# Patient Record
Sex: Female | Born: 1985 | Race: White | Hispanic: No | Marital: Married | Smoking: Never smoker
Health system: Southern US, Community
[De-identification: ages and names within clinical notes are randomized; demographics above are authoritative.]

## PROBLEM LIST (undated history)

## (undated) DIAGNOSIS — I10 Essential (primary) hypertension: Secondary | ICD-10-CM

---

## 2018-10-24 ENCOUNTER — Encounter: Payer: Self-pay | Admitting: Family Medicine

## 2018-10-24 ENCOUNTER — Other Ambulatory Visit: Payer: Self-pay

## 2018-10-24 ENCOUNTER — Emergency Department (INDEPENDENT_AMBULATORY_CARE_PROVIDER_SITE_OTHER)
Admission: EM | Admit: 2018-10-24 | Discharge: 2018-10-24 | Disposition: A | Discharge status: Home / Self Care | Payer: BC Managed Care – PPO | Source: Home / Self Care

## 2018-10-24 DIAGNOSIS — S61432A Puncture wound without foreign body of left hand, initial encounter: Secondary | ICD-10-CM | POA: Diagnosis not present

## 2018-10-24 DIAGNOSIS — Z23 Encounter for immunization: Secondary | ICD-10-CM

## 2018-10-24 MED ORDER — MUPIROCIN 2 % EX OINT: 1.0000 "application " | TOPICAL_OINTMENT | Freq: Three times a day (TID) | CUTANEOUS | 1 refills | Status: AC

## 2018-10-24 MED ORDER — TETANUS-DIPHTH-ACELL PERTUSSIS 5-2.5-18.5 LF-MCG/0.5 IM SUSP
0.5000 mL | Freq: Once | INTRAMUSCULAR | Status: AC
  Administered 2018-10-24: 0.5 mL via INTRAMUSCULAR

## 2018-10-24 NOTE — ED Provider Notes (Signed)
[facility]    CSN: [id number] Arrival date & time: 10/24/18  1604      History   Chief Complaint Chief Complaint  Patient presents with  . Puncture Wound    HPI Katie Gordon is a 33 y.o. female.   This is the initial [facility] urgent care visit for this 33 year old woman who presents with a left hand laceration.  She punctured the thumb web space last night and every time she opens her hand wide, it bleeds.  Minimal pain.  Unsure of last Td.     History reviewed. No pertinent past medical history.  There are no active problems to display for this patient.   History reviewed. No pertinent surgical history.  OB History   No obstetric history on file.      Home Medications    Prior to Admission medications   Medication Sig Start Date End Date Taking? Authorizing Provider  mupirocin ointment (BACTROBAN) 2 % Apply 1 application topically 3 (three) times daily. 10/24/18   Elvina Sidle, MD    Family History History reviewed. No pertinent family history.  Social History Social History   Tobacco Use  . Smoking status: Not on file  Substance Use Topics  . Alcohol use: Not on file  . Drug use: Not on file     Allergies   Patient has no known allergies.   Review of Systems Review of Systems  Constitutional: Negative.   Skin: Positive for wound.  All other systems reviewed and are negative.    Physical Exam Triage Vital Signs ED Triage Vitals  Enc Vitals Group     BP      Pulse      Resp      Temp      Temp src      SpO2      Weight      Height      Head Circumference      Peak Flow      Pain Score      Pain Loc      Pain Edu?      Excl. in GC?    No data found.  Updated Vital Signs BP 139/90 (BP Location: Left Arm)   Pulse 79   Temp 98.7 F (37.1 C) (Oral)   Ht 5\' 5"  (1.651 m)   Wt 121 kg   SpO2 98%   BMI 44.40 kg/m   Physical Exam Vitals signs and nursing note reviewed.  Constitutional:    Appearance: Normal appearance. She is obese.  Neck:     Musculoskeletal: Normal range of motion and neck supple.  Pulmonary:     Effort: Pulmonary effort is normal.  Musculoskeletal: Normal range of motion.  Skin:    General: Skin is warm and dry.     Comments: 59mm wound left thumb web space with full ROM.  No ecchymosis or erythema.  Neurological:     General: No focal deficit present.     Mental Status: She is alert.  Psychiatric:        Mood and Affect: Mood normal.        UC Treatments / Results  Labs (all labs ordered are listed, but only abnormal results are displayed) Labs Reviewed - No data to display  EKG   Radiology No results found.  Procedures Procedures (including critical care time)  Medications Ordered in UC Medications  Tdap (BOOSTRIX) injection 0.5 mL (has no administration in time range)    Initial  Impression / Assessment and Plan / UC Course  I have reviewed the triage vital signs and the nursing notes.  Pertinent labs & imaging results that were available during my care of the patient were reviewed by me and considered in my medical decision making (see chart for details).    Final Clinical Impressions(s) / UC Diagnoses   Final diagnoses:  Puncture wound of left hand without foreign body, initial encounter     Discharge Instructions     Continue to wash and apply antibiotic cream three times a day for a week.   Keep covered while at work    ED Prescriptions    Medication [place]. Provider   mupirocin ointment (BACTROBAN) 2 % Apply 1 application topically 3 (three) times daily. 22 g Robyn Haber, MD     I have reviewed the PDMP during this encounter.   Robyn Haber, MD 10/24/18 1635

## 2018-10-24 NOTE — Discharge Instructions (Addendum)
Continue to wash and apply antibiotic cream three times a day for a week.   Keep covered while at work

## 2018-10-24 NOTE — ED Triage Notes (Signed)
Here with small puncture wound to left hand/web area after reaching in dish disposal. Cut hand on scissors. Unsure of last Tdap.

## 2018-12-18 ENCOUNTER — Encounter: Payer: Self-pay | Admitting: Emergency Medicine

## 2018-12-18 ENCOUNTER — Emergency Department (INDEPENDENT_AMBULATORY_CARE_PROVIDER_SITE_OTHER)
Admission: EM | Admit: 2018-12-18 | Discharge: 2018-12-18 | Disposition: A | Discharge status: Home / Self Care | Payer: BC Managed Care – PPO | Source: Home / Self Care | Attending: Family Medicine | Admitting: Family Medicine

## 2018-12-18 ENCOUNTER — Other Ambulatory Visit: Payer: Self-pay

## 2018-12-18 DIAGNOSIS — S83412A Sprain of medial collateral ligament of left knee, initial encounter: Secondary | ICD-10-CM

## 2018-12-18 DIAGNOSIS — S93401A Sprain of unspecified ligament of right ankle, initial encounter: Secondary | ICD-10-CM

## 2018-12-18 DIAGNOSIS — S83411A Sprain of medial collateral ligament of right knee, initial encounter: Secondary | ICD-10-CM | POA: Diagnosis not present

## 2018-12-18 HISTORY — DX: Essential (primary) hypertension: I10

## 2018-12-18 NOTE — Discharge Instructions (Signed)
Apply ice pack for 20 to 30 minutes, 3 to 4 times daily  Continue until pain and swelling decrease.  Wear knee and ankle braces.  Take 2 Aleve tabs every 12 hours with food.  May take Tylenol as needed for pain.

## 2018-12-18 NOTE — ED Provider Notes (Signed)
[facility]    CSN: [id number] Arrival date & time: 12/18/18  0950      History   Chief Complaint Chief Complaint  Patient presents with  . Knee Pain    HPI Katie Gordon is a 33 y.o. female.   Patient was dancing and partying at home last.  She recalls being in a squatting position, beginning to stand up when both knees painfully gave way and she fell.  She also twisted her right ankle.  She has had persistent pain/instability in both knees, and right ankle pain.  The history is provided by the patient.  Knee Pain Location:  Knee Time since incident:  1 day Injury: yes   Knee location:  L knee and R knee Pain details:    Quality:  Aching   Radiates to:  Does not radiate   Severity:  Severe   Onset quality:  Sudden   Duration:  1 day   Timing:  Constant   Progression:  Unchanged Chronicity:  New Prior injury to area:  No Relieved by:  Nothing Worsened by:  Bearing weight and activity Ineffective treatments:  None tried Associated symptoms: decreased ROM, muscle weakness, stiffness and swelling   Associated symptoms: no numbness and no tingling   Risk factors: obesity     Past Medical History:  Diagnosis Date  . Hypertension     There are no active problems to display for this patient.   Past Surgical History:  Procedure Laterality Date  . CESAREAN SECTION      OB History   No obstetric history on file.      Home Medications    Prior to Admission medications   Medication Sig Start Date End Date Taking? Authorizing Provider  labetalol (NORMODYNE) 300 MG tablet Take 300 mg by mouth 2 (two) times daily.   Yes [provider]  Prenatal Vit-Fe Fumarate-FA (MULTIVITAMIN-PRENATAL) 27-0.8 MG TABS tablet Take 1 tablet by mouth daily at 12 noon.   Yes [provider]  mupirocin ointment (BACTROBAN) 2 % Apply 1 application topically 3 (three) times daily. 10/24/18   Elvina SidleLauenstein, Kurt, MD    Family History No family history  on file.  Social History Social History   Tobacco Use  . Smoking status: Never Smoker  . Smokeless tobacco: Never Used  Substance Use Topics  . Alcohol use: Yes  . Drug use: Not on file     Allergies   Patient has no known allergies.   Review of Systems Review of Systems  Musculoskeletal: Positive for stiffness.       Right ankle pain  All other systems reviewed and are negative.    Physical Exam Triage Vital Signs ED Triage Vitals  Enc Vitals Group     BP 12/18/18 1007 (!) 143/102     Pulse Rate 12/18/18 1007 (!) 109     Resp 12/18/18 1007 18     Temp 12/18/18 1007 98.5 F (36.9 C)     Temp Source 12/18/18 1007 Oral     SpO2 12/18/18 1007 95 %     Weight 12/18/18 1009 275 lb 9.2 oz (125 kg)     Height 12/18/18 1009 5\' 5"  (1.651 m)     Head Circumference --      Peak Flow --      Pain Score 12/18/18 1008 6     Pain Loc --      Pain Edu? --      Excl. in GC? --  No data found.  Updated Vital Signs BP (!) 143/102 (BP Location: Right Arm)   Pulse (!) 109   Temp 98.5 F (36.9 C) (Oral)   Resp 18   Ht 5\' 5"  (1.651 m)   Wt 125 kg   LMP 11/28/2018 (Exact Date) Comment: trying to conceive  SpO2 95%   BMI 45.86 kg/m   Visual Acuity Right Eye Distance:   Left Eye Distance:   Bilateral Distance:    Right Eye Near:   Left Eye Near:    Bilateral Near:     Physical Exam Vitals and nursing note reviewed.  Constitutional:      General: She is not in acute distress.    Appearance: She is obese.  HENT:     Head: Atraumatic.  Eyes:     Pupils: Pupils are equal, round, and reactive to light.  Cardiovascular:     Rate and Rhythm: Tachycardia present.  Pulmonary:     Effort: Pulmonary effort is normal.  Musculoskeletal:     Cervical back: Normal range of motion.     Right knee: Swelling present. No deformity or crepitus. Decreased range of motion. Tenderness present. Normal patellar mobility.     Left knee: Swelling present. No deformity or crepitus.  Decreased range of motion. Tenderness present. Normal patellar mobility.     Right ankle: Swelling present. No deformity. Tenderness present over the medial malleolus. Decreased range of motion.       Legs:     Comments: Bilateral knees:  Both knees swollen.  Decreased range of motion to full flexion.  Knees stable, negative drawer test.  McMurray test negative.  Bilateral tenderness to palpation over medial joint lines and/or MCL.  Distal neurovascular function is intact.   Right ankle:  Decreased range of motion.  Tenderness and swelling over the medial malleolus.  Joint stable.  No tenderness over the base of the fifth metatarsal.  Distal neurovascular function is intact.      Skin:    General: Skin is warm and dry.  Neurological:     Mental Status: She is alert.      UC Treatments / Results  Labs (all labs ordered are listed, but only abnormal results are displayed) Labs Reviewed - No data to display  EKG   Radiology No results found.  Procedures Procedures (including critical care time)  Medications Ordered in UC Medications - No data to display  Initial Impression / Assessment and Plan / UC Course  I have reviewed the triage vital signs and the nursing notes.  Pertinent labs & imaging results that were available during my care of the patient were reviewed by me and considered in my medical decision making (see chart for details).    Dispensed hinged knee braces, and AirCast stirrup splint for right ankle. Followup with Dr. 11/30/2018 ([facility] in about 4 days for management.   Final Clinical Impressions(s) / UC Diagnoses   Final diagnoses:  Sprain of medial collateral ligament of right knee, initial encounter  Sprain of medial collateral ligament of left knee, initial encounter  Sprain of right ankle, unspecified ligament, initial encounter     Discharge Instructions     Apply ice pack for 20 to 30 minutes, 3 to 4 times daily   Continue until pain and swelling decrease.  Wear knee and ankle braces.  Take 2 Aleve tabs every 12 hours with food.  May take Tylenol as needed for pain.    ED Prescriptions  None        Kandra Nicolas, MD 12/24/18 1039

## 2018-12-18 NOTE — ED Triage Notes (Signed)
Patient was dancing at home last night; some alcohol intake; suddenly knees 'buckled' and she fell down; later in evening it happened again. Today both legs are sore.  She has not had influenza vacc this season.  She has not been in contact with covid positive person and has not travelled past 4 weeks.

## 2018-12-18 NOTE — ED Triage Notes (Signed)
Patient took aleve at 0300.  She is trying to conceive.

## 2018-12-22 ENCOUNTER — Ambulatory Visit (INDEPENDENT_AMBULATORY_CARE_PROVIDER_SITE_OTHER): Payer: BC Managed Care – PPO | Admitting: Sports Medicine

## 2018-12-22 ENCOUNTER — Ambulatory Visit (INDEPENDENT_AMBULATORY_CARE_PROVIDER_SITE_OTHER): Payer: BC Managed Care – PPO

## 2018-12-22 ENCOUNTER — Encounter: Payer: Self-pay | Admitting: Sports Medicine

## 2018-12-22 ENCOUNTER — Other Ambulatory Visit: Payer: Self-pay

## 2018-12-22 DIAGNOSIS — M25561 Pain in right knee: Secondary | ICD-10-CM

## 2018-12-22 DIAGNOSIS — M2391 Unspecified internal derangement of right knee: Secondary | ICD-10-CM | POA: Diagnosis not present

## 2018-12-22 DIAGNOSIS — M2392 Unspecified internal derangement of left knee: Secondary | ICD-10-CM

## 2018-12-22 DIAGNOSIS — S99911A Unspecified injury of right ankle, initial encounter: Secondary | ICD-10-CM | POA: Diagnosis not present

## 2018-12-22 DIAGNOSIS — M25562 Pain in left knee: Secondary | ICD-10-CM

## 2018-12-22 IMAGING — DX DG ANKLE COMPLETE 3+V*R*
8 series · 8 of 8 positions shown · non-contrast
Comparison: No prior.

CLINICAL DATA: Fall.  Pain over medial ankle.

EXAM:
RIGHT ANKLE - COMPLETE 3+ VIEW

[ankle ap]
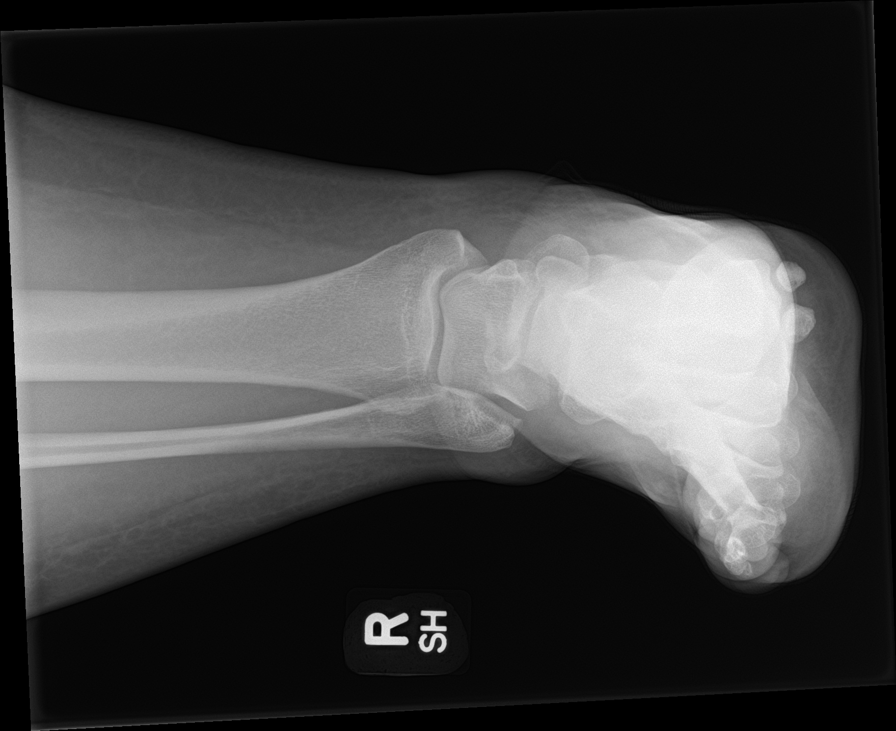

[ankle obl]
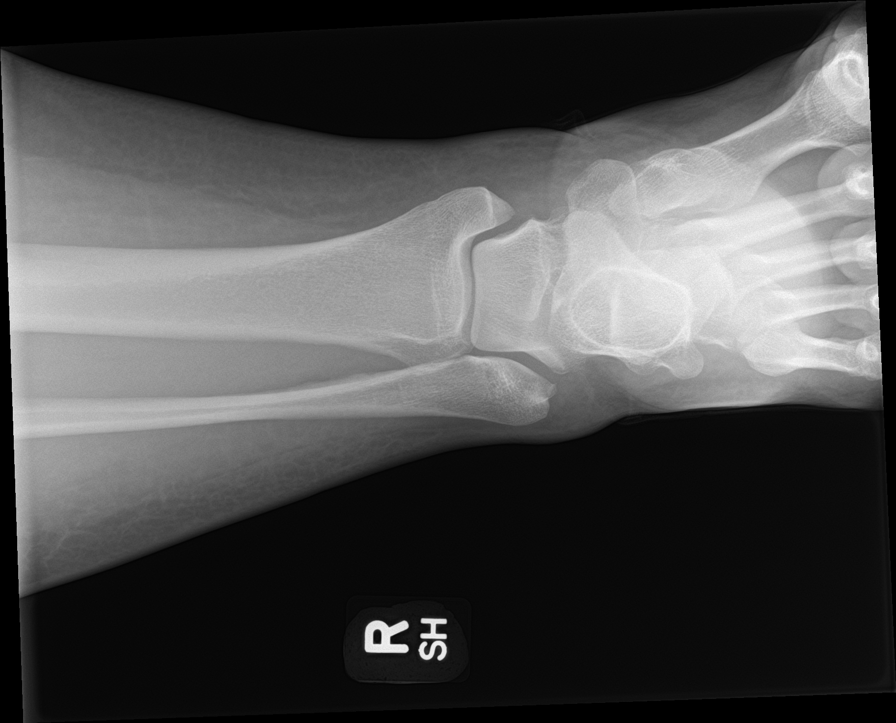

[knee lat (1 of 2)]
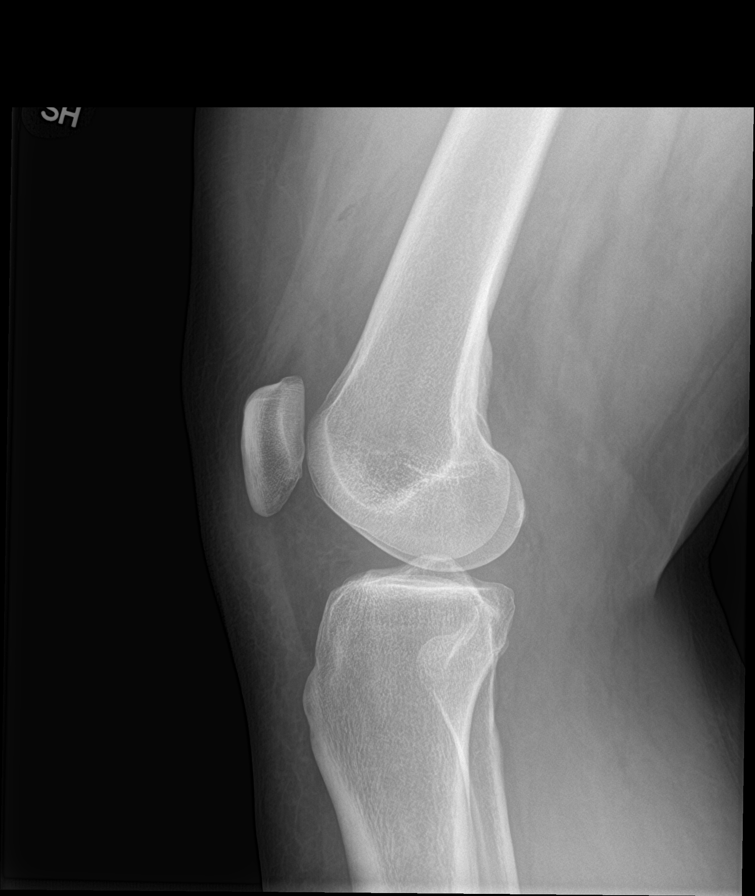

[ankle lat]
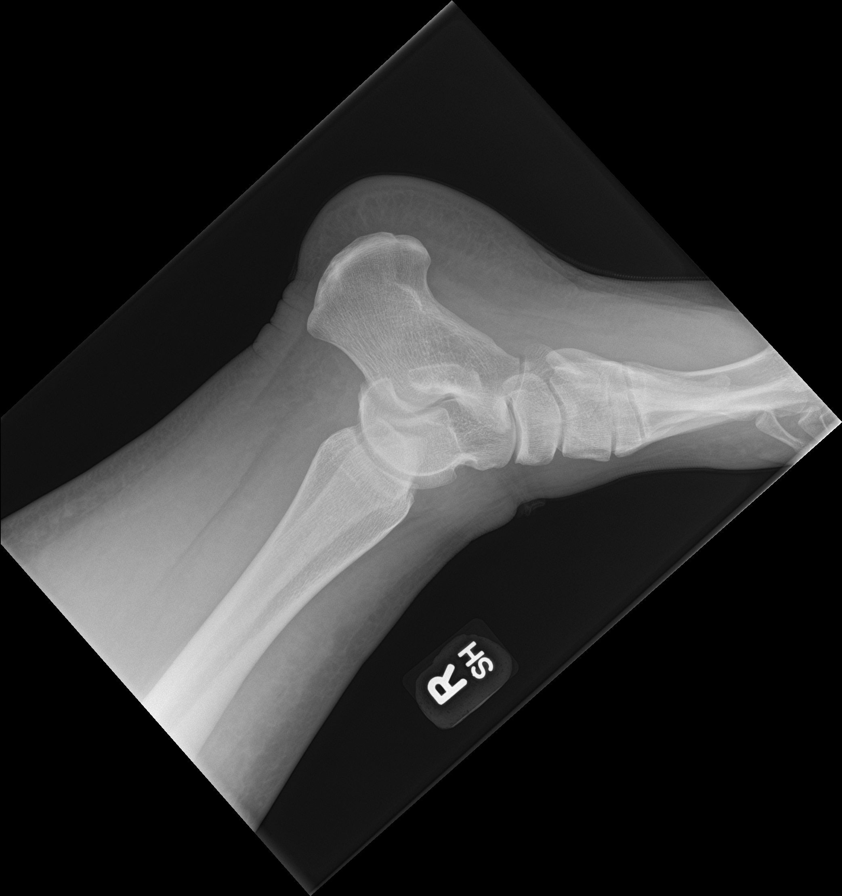

[knee sunrise]
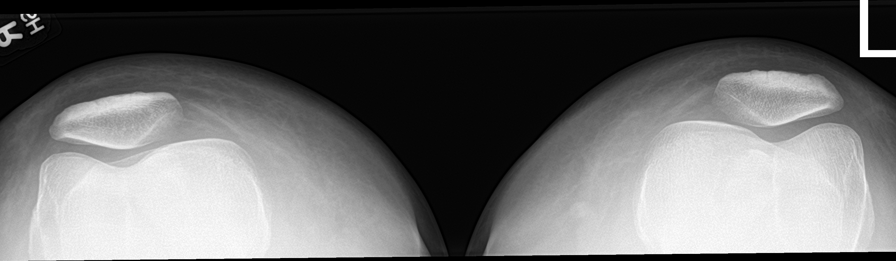

[knee ap bilat standing (1 of 2)]
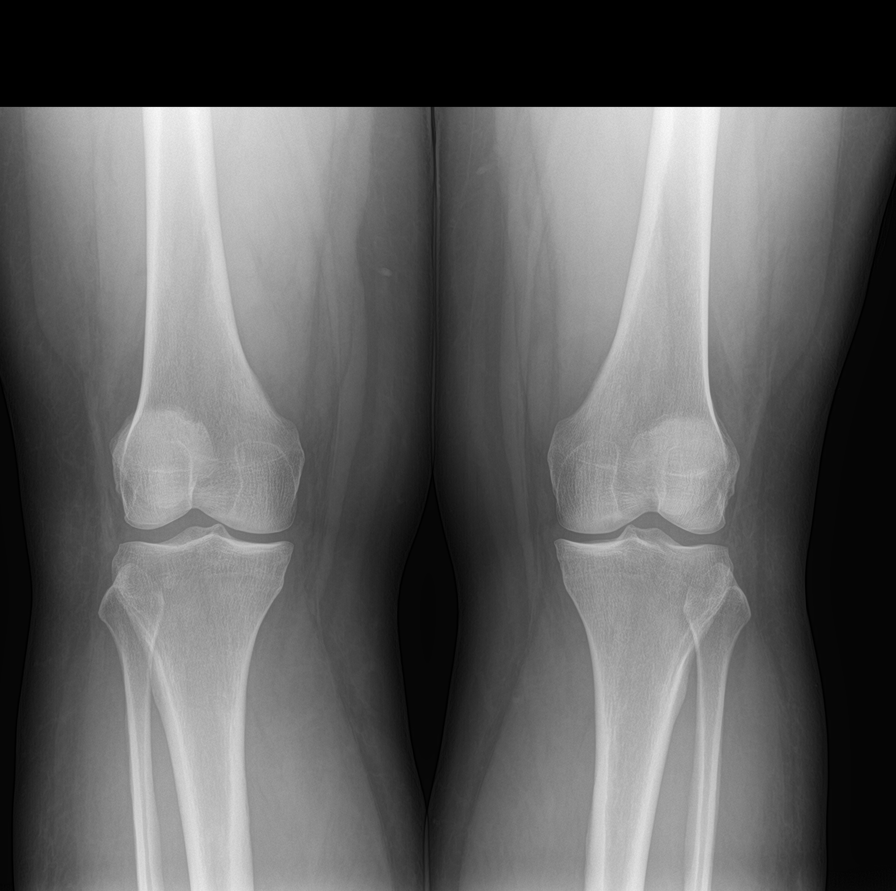

[knee ap bilat standing (2 of 2)]
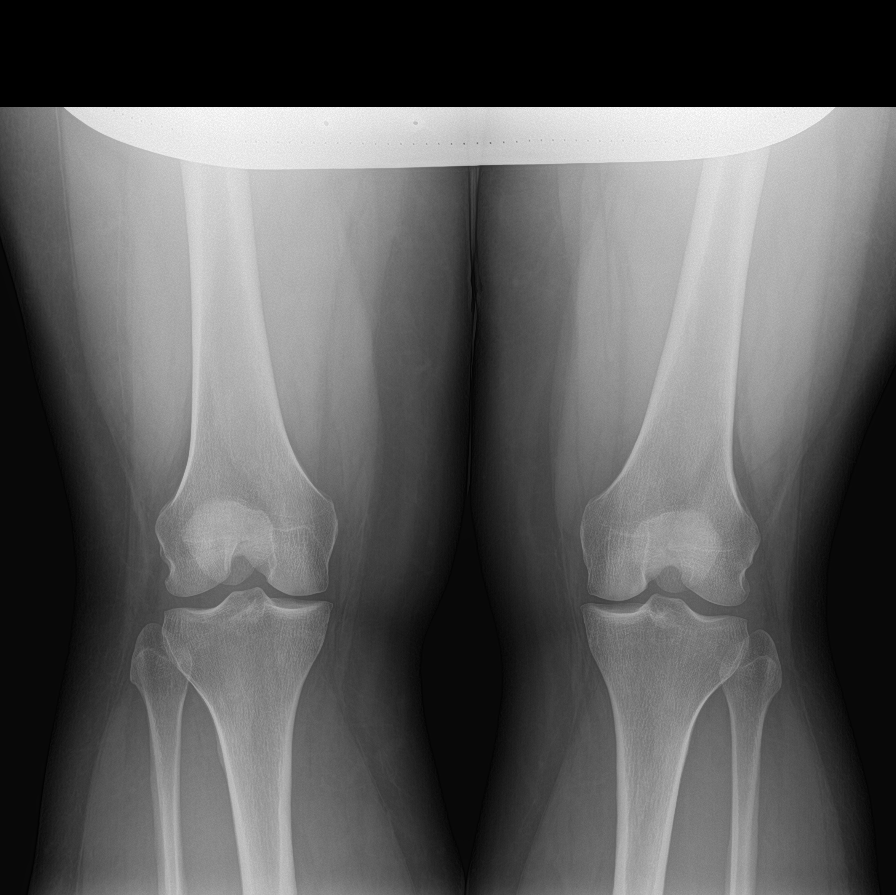

[knee lat (2 of 2)]
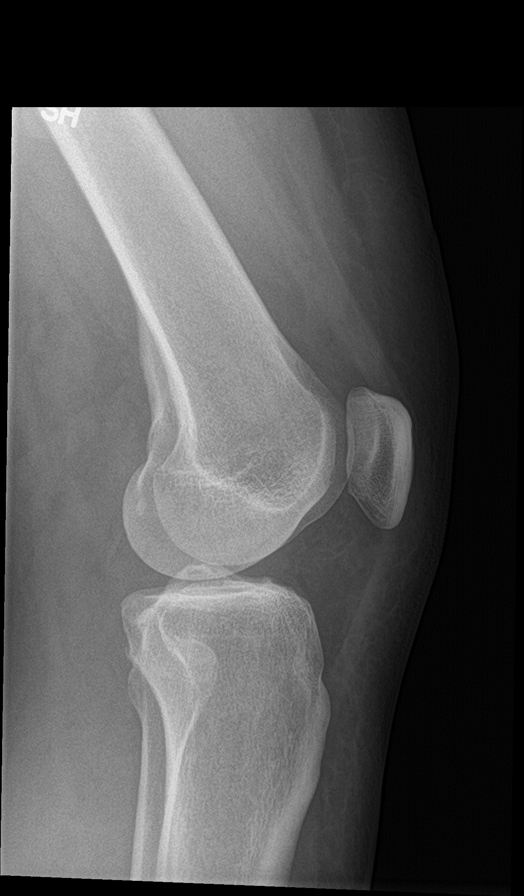

[8 of 8 positions shown; findings below may reference images not displayed]

FINDINGS: Diffuse soft tissue swelling. No acute bony or joint abnormality. No
evidence of fracture dislocation.
IMPRESSION: Diffuse soft tissue swelling.  No acute bony or joint abnormality.

## 2018-12-22 IMAGING — DX DG KNEE COMPLETE 4+V*L*
5 series · 5 of 5 positions shown · non-contrast
Comparison: No prior.

CLINICAL DATA: Fall.  Posterior knee pain.  Cortisone shots today P

EXAM:
LEFT KNEE - COMPLETE 4+ VIEW

[knee lat (1 of 2)]
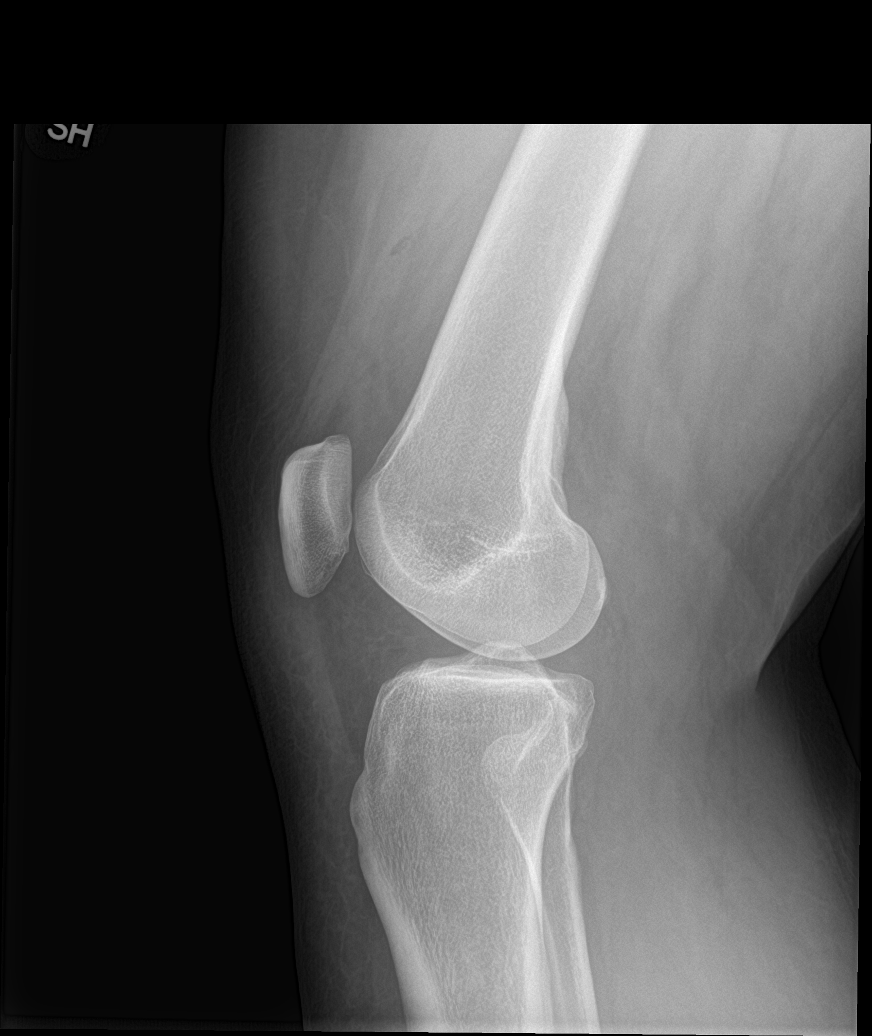

[knee sunrise]
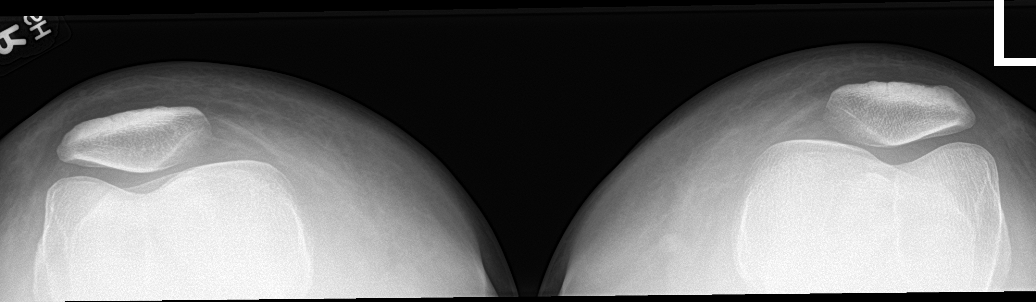

[knee ap bilat standing (1 of 2)]
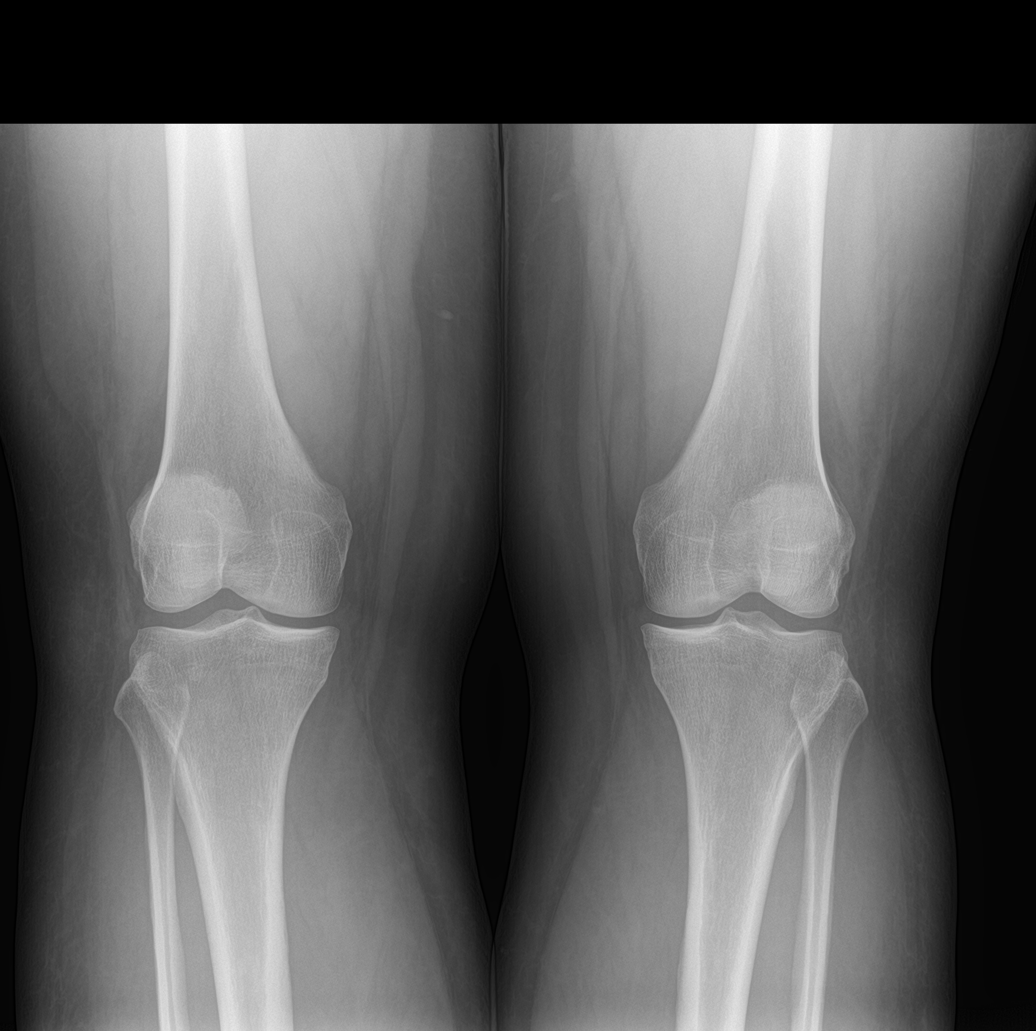

[knee ap bilat standing (2 of 2)]
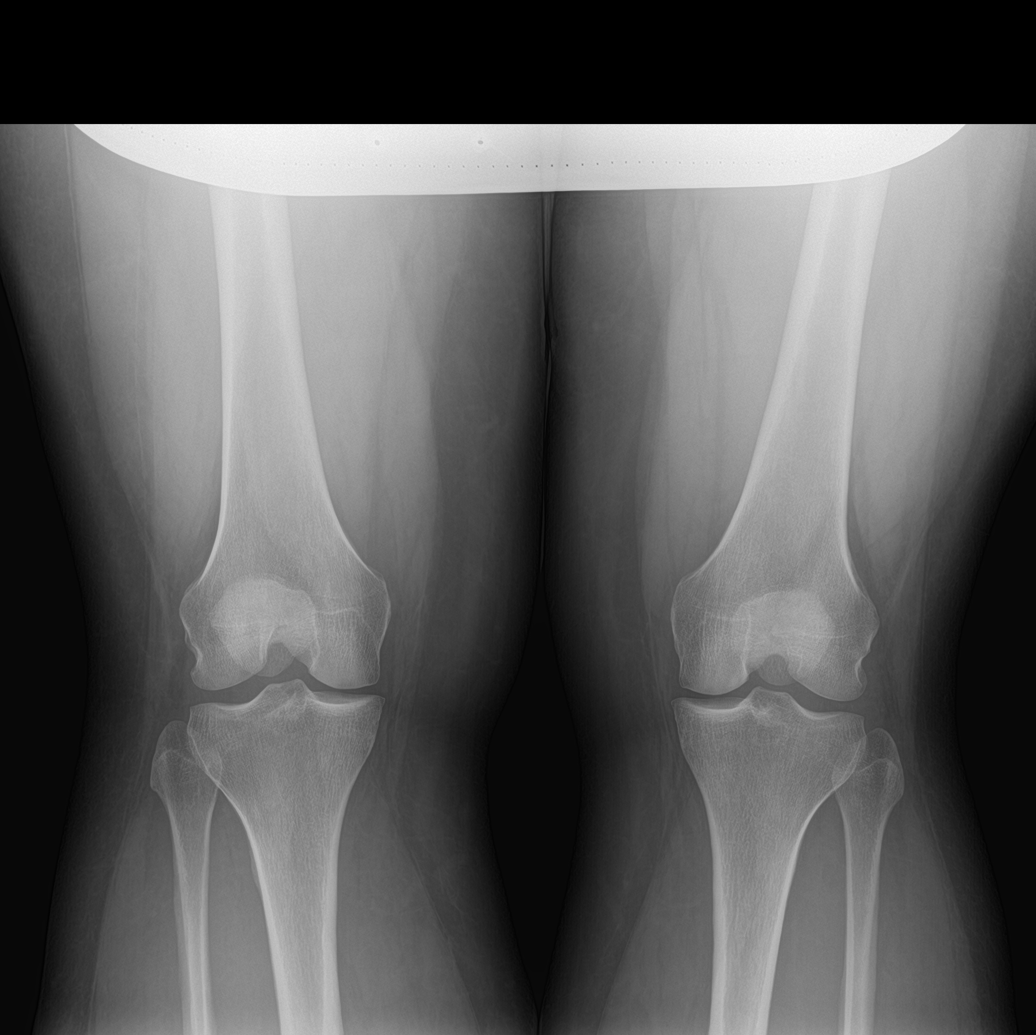

[knee lat (2 of 2)]
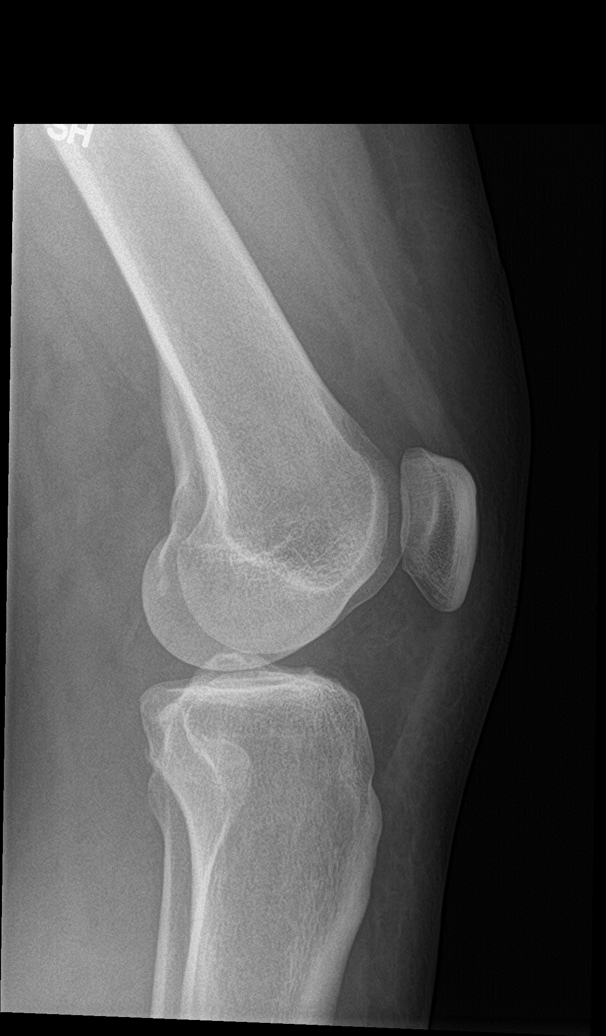

[5 of 5 positions shown; findings below may reference images not displayed]

FINDINGS: Bilateral knee series obtained. Small amount of air noted the right
suprapatellar space most likely related to recent cortisone shot. No
acute bony or joint abnormality identified. No evidence of fracture
dislocation. No prominent effusion.
IMPRESSION: No acute bony or joint abnormality identified.

## 2018-12-22 MED ORDER — HYDROCODONE-ACETAMINOPHEN 5-325 MG PO TABS: 1.0000 | ORAL_TABLET | Freq: Three times a day (TID) | ORAL | 0 refills | Status: AC | PRN

## 2018-12-22 NOTE — Assessment & Plan Note (Addendum)
Swelling, pain, multiple previous injuries. X-rays, MRI.  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   Edema in the talus bone, all consistent with severe ankle sprain.   Even after the sprain heals there will likely be chronic pain in the ankle considering the visible osteoarthritis.

## 2018-12-22 NOTE — Progress Notes (Addendum)
Subjective:    CC: Bilateral knee pain, right ankle pain  HPI:  This is a pleasant 33 year old female, she was partying with her girlfriends drinking some whiskey, she went to stand up and felt immediate pain in both knees, they went limp and she fell.  Since then she is had pain, swelling in both knees, right worse than left.  She has significant mechanical symptoms including locking and buckling.  She also history multiple right ankle injuries, multiple fractures, she is continuing to have pain now in her right medial malleolus.  I reviewed the past medical history, family history, social history, surgical history, and allergies today and no changes were needed.  Please see the problem list section below in epic for further details.  Past Medical History: Past Medical History:  Diagnosis Date  . Hypertension    Past Surgical History: Past Surgical History:  Procedure Laterality Date  . CESAREAN SECTION     Social History: Social History   Socioeconomic History  . Marital status: Married    Spouse name: Not on file  . Number of children: Not on file  . Years of education: Not on file  . Highest education level: Not on file  Occupational History  . Not on file  Tobacco Use  . Smoking status: Never Smoker  . Smokeless tobacco: Never Used  Substance and Sexual Activity  . Alcohol use: Yes  . Drug use: Not on file  . Sexual activity: Not on file  Other Topics Concern  . Not on file  Social History Narrative  . Not on file   Social Determinants of Health   Financial Resource Strain:   . Difficulty of Paying Living Expenses: Not on file  Food Insecurity:   . Worried About Charity fundraiser in the Last Year: Not on file  . Ran Out of Food in the Last Year: Not on file  Transportation Needs:   . Lack of Transportation (Medical): Not on file  . Lack of Transportation (Non-Medical): Not on file  Physical Activity:   . Days of Exercise per Week: Not on file  .  Minutes of Exercise per Session: Not on file  Stress:   . Feeling of Stress : Not on file  Social Connections:   . Frequency of Communication with Friends and Family: Not on file  . Frequency of Social Gatherings with Friends and Family: Not on file  . Attends Religious Services: Not on file  . Active Member of Clubs or Organizations: Not on file  . Attends Archivist Meetings: Not on file  . Marital Status: Not on file   Family History: Family History  Problem Relation Age of Onset  . Cancer Maternal Grandfather        colon   Allergies: No Known Allergies Medications: See med rec.  Review of Systems: No headache, visual changes, nausea, vomiting, diarrhea, constipation, dizziness, abdominal pain, skin rash, fevers, chills, night sweats, swollen lymph nodes, weight loss, chest pain, body aches, joint swelling, muscle aches, shortness of breath, mood changes, visual or auditory hallucinations.  Objective:    General: Well Developed, well nourished, and in no acute distress.  Neuro: Alert and oriented x3, extra-ocular muscles intact, sensation grossly intact.  HEENT: Normocephalic, atraumatic, pupils equal round reactive to light, neck supple, no masses, no lymphadenopathy, thyroid nonpalpable.  Skin: Warm and dry, no rashes noted.  Cardiac: Regular rate and rhythm, no murmurs rubs or gallops.  Respiratory: Clear to auscultation bilaterally. Not using  accessory muscles, speaking in full sentences.  Abdominal: Soft, nontender, nondistended, positive bowel sounds, no masses, no organomegaly.  Bilateral knees: Swollen, right worse than left, tender to palpation at the joint lines. ROM normal in flexion and extension and lower leg rotation. Ligaments with solid consistent endpoints including ACL, PCL, LCL, MCL. Negative Mcmurray's and provocative meniscal tests. Non painful patellar compression. Patellar and quadriceps tendons unremarkable. Hamstring and quadriceps  strength is normal. Right ankle: Swollen, palpable effusion. Range of motion is full in all directions. Strength is 5/5 in all directions. Stable lateral and medial ligaments; squeeze test and kleiger test unremarkable; Talar dome nontender; No pain at base of 5th MT; No tenderness over cuboid; No tenderness over N spot or navicular prominence Tender at the medial malleolus. No sign of peroneal tendon subluxations; Negative tarsal tunnel tinel's Able to walk 4 steps.  Procedure: Real-time Ultrasound Guided  aspiration/injection of right knee Device: Samsung HS60  Verbal informed consent obtained.  Time-out conducted.  Noted no overlying erythema, induration, or other signs of local infection.  Skin prepped in a sterile fashion.  Local anesthesia: Topical Ethyl chloride.  With sterile technique and under real time ultrasound guidance:  Using an 18-gauge needle aspirated 20 cc of serosanguineous fluid, syringe switched and 1 cc Kenalog 40, 2 cc lidocaine, 2 cc bupivacaine injected easily Completed without difficulty  Pain immediately resolved suggesting accurate placement of the medication.  Advised to call if fevers/chills, erythema, induration, drainage, or persistent bleeding.  Images permanently stored and available for review in the ultrasound unit.  Impression: Technically successful ultrasound guided injection.  Procedure: Real-time Ultrasound Guided  aspiration/injection of left knee Device: Samsung HS60  Verbal informed consent obtained.  Time-out conducted.  Noted no overlying erythema, induration, or other signs of local infection.  Skin prepped in a sterile fashion.  Local anesthesia: Topical Ethyl chloride.  With sterile technique and under real time ultrasound guidance:  Using an 18-gauge needle aspirated 5 cc of clear yellow fluid, syringe switched and 1 cc Kenalog 40, 2 cc lidocaine, 2 cc bupivacaine injected easily Completed without difficulty  Pain immediately  resolved suggesting accurate placement of the medication.  Advised to call if fevers/chills, erythema, induration, drainage, or persistent bleeding.  Images permanently stored and available for review in the ultrasound unit.  Impression: Technically successful ultrasound guided injection.  Impression and Recommendations:    The patient was counselled, risk factors were discussed, anticipatory guidance given.  Internal derangement of both knees Swelling, pain, mechanical symptoms, bilateral x-rays, aspiration and injection of both knees. Bilateral MRIs. Hydrocodone for pain. Follow-up in 1 month.  MRI obtained, looks like she tore both of her ACLs at the same time.   Ultimately this is going to need bilateral ACL reconstruction, does she have a surgeon in mind?  Right ankle injury Swelling, pain, multiple previous injuries. X-rays, MRI.  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   Edema in the talus bone, all consistent with severe ankle sprain.   Even after the sprain heals there will likely be chronic pain in the ankle considering the visible osteoarthritis.   ___________________________________________ Ihor Austin. Benjamin Stain, M.D., ABFM., CAQSM. Primary Care and Sports Medicine [facility]  Adjunct Professor of Family Medicine  [employer]

## 2018-12-22 NOTE — Assessment & Plan Note (Addendum)
Swelling, pain, mechanical symptoms, bilateral x-rays, aspiration and injection of both knees. Bilateral MRIs. Hydrocodone for pain. Follow-up in 1 month.  MRI obtained, looks like she tore both of her ACLs at the same time.   Ultimately this is going to need bilateral ACL reconstruction, does she have a surgeon in mind?

## 2018-12-23 ENCOUNTER — Telehealth: Payer: Self-pay

## 2018-12-23 NOTE — Telephone Encounter (Signed)
MRI for ankle has been approved  Peer to peer required for approval on both the L and R knee MRI w/o contrast.   Please call: [phone]   Mem ID: [id number]

## 2018-12-23 NOTE — Telephone Encounter (Signed)
Peer to peer done tonight, from home, approved:  Auth for both knees: [id number] valid from 12/10-01/08.

## 2018-12-24 NOTE — Telephone Encounter (Signed)
Thank you!!!!! Imaging advised. Patient getting scheduled this weekend

## 2018-12-26 ENCOUNTER — Ambulatory Visit: Payer: BC Managed Care – PPO

## 2018-12-26 ENCOUNTER — Ambulatory Visit (INDEPENDENT_AMBULATORY_CARE_PROVIDER_SITE_OTHER): Payer: BC Managed Care – PPO

## 2018-12-26 ENCOUNTER — Other Ambulatory Visit: Payer: Self-pay

## 2018-12-26 DIAGNOSIS — M2392 Unspecified internal derangement of left knee: Secondary | ICD-10-CM

## 2018-12-26 DIAGNOSIS — M2391 Unspecified internal derangement of right knee: Secondary | ICD-10-CM

## 2018-12-26 DIAGNOSIS — M25562 Pain in left knee: Secondary | ICD-10-CM

## 2018-12-26 DIAGNOSIS — M25561 Pain in right knee: Secondary | ICD-10-CM

## 2018-12-26 IMAGING — MR MR KNEE*L* W/O CM
7 series · 40 of 40 positions shown · non-contrast
Comparison: None.

CLINICAL DATA: And knee pain after injury

EXAM:
MRI OF THE LEFT KNEE WITHOUT CONTRAST
TECHNIQUE: Multiplanar, multisequence MR imaging of the knee was performed. No
intravenous contrast was administered.

[Series 3: T2 fat-sat · axial · 4.0mm · 0.53mm/px · z∈[-89,+81]mm · 5 of 35 slices shown (1 of 3)]
[im 1/35]
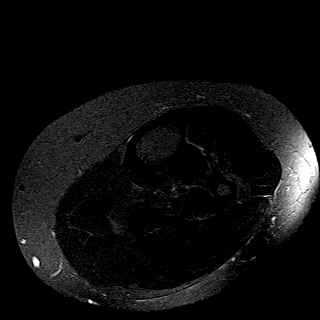
[im 9/35]
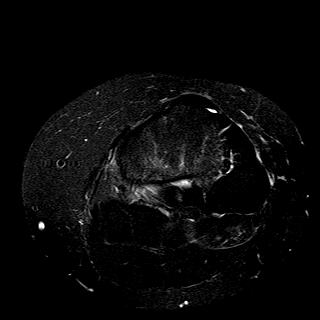
[im 18/35]
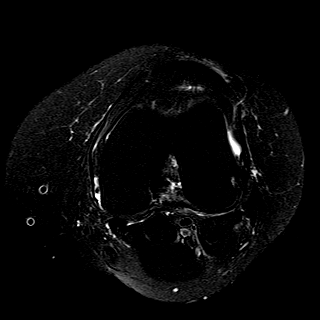
[im 26/35]
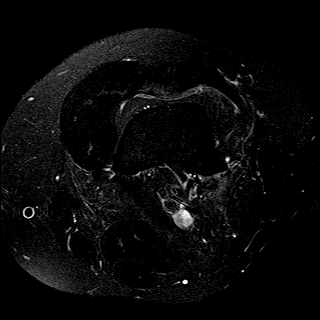
[im 35/35]
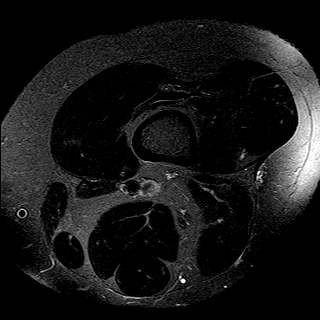

[Series 4: T1 · coronal · 4.0mm · 0.62mm/px · 6 of 31 slices shown]
[im 1/31]
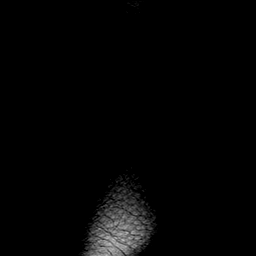
[im 7/31]
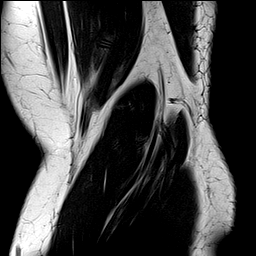
[im 13/31]
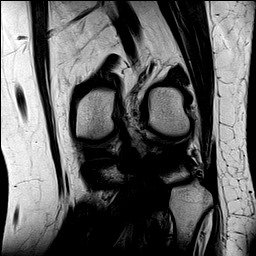
[im 19/31]
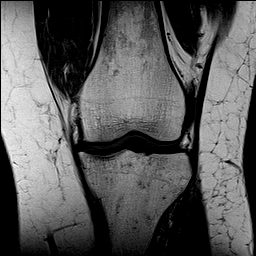
[im 25/31]
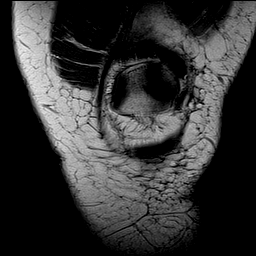
[im 31/31]
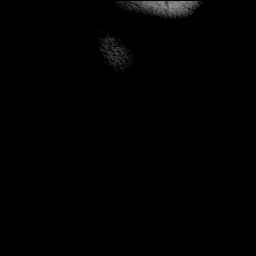

[Series 5: T2 fat-sat · coronal · 4.0mm · 0.62mm/px · 6 of 31 slices shown (2 of 3)]
[im 1/31]
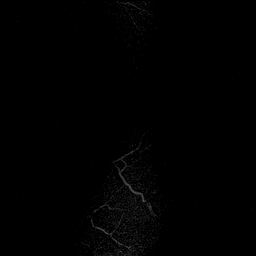
[im 7/31]
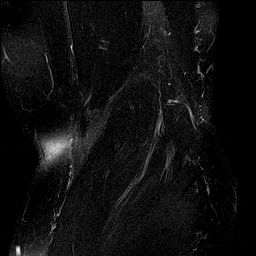
[im 13/31]
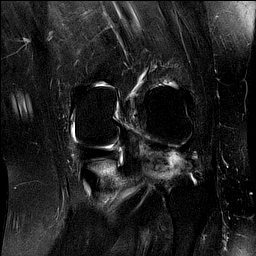
[im 19/31]
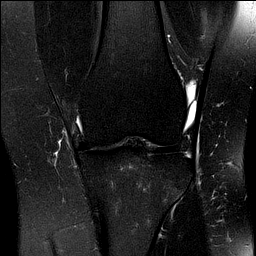
[im 25/31]
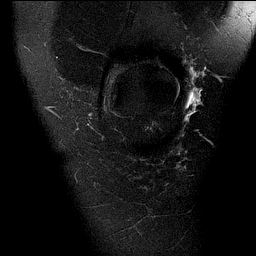
[im 31/31]
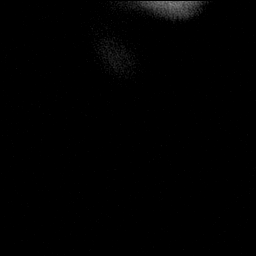

[Series 6: PD fat-sat · coronal · 4.0mm · 0.62mm/px · 6 of 31 slices shown (1 of 3)]
[im 1/31]
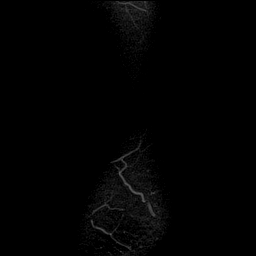
[im 7/31]
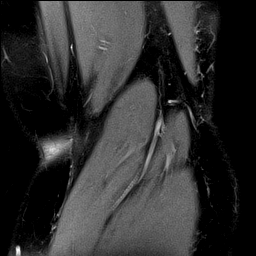
[im 13/31]
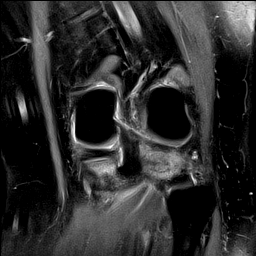
[im 19/31]
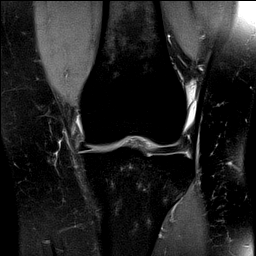
[im 25/31]
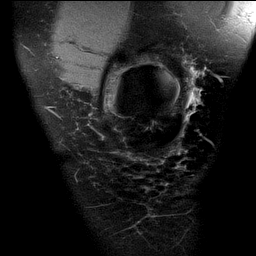
[im 31/31]
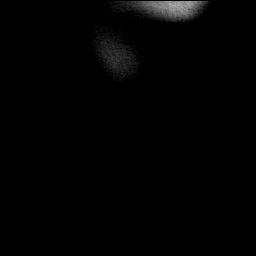

[Series 7: PD fat-sat · sagittal · 3.0mm · 0.62mm/px · 7 of 37 slices shown (2 of 3)]
[im 1/37]
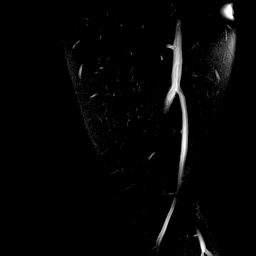
[im 7/37]
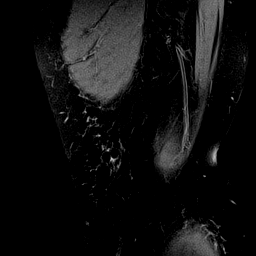
[im 13/37]
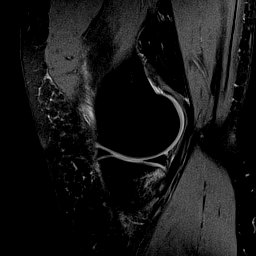
[im 19/37]
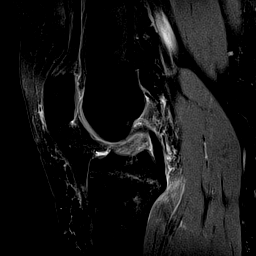
[im 25/37]
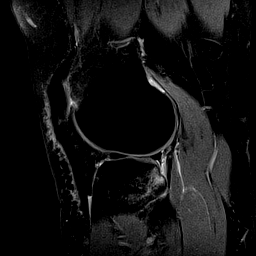
[im 31/37]
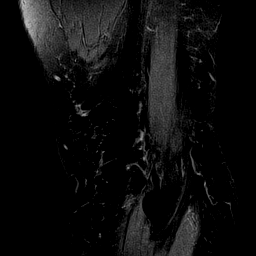
[im 37/37]
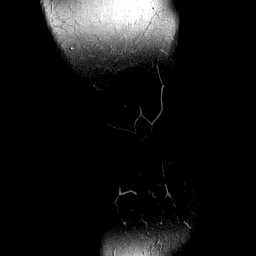

[Series 8: T2 fat-sat · sagittal · 3.0mm · 0.62mm/px · 7 of 37 slices shown (3 of 3)]
[im 1/37]
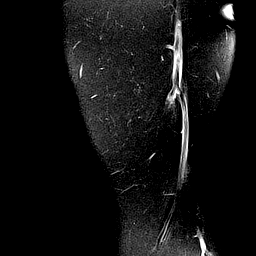
[im 7/37]
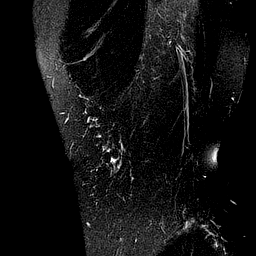
[im 13/37]
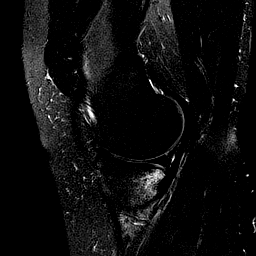
[im 19/37]
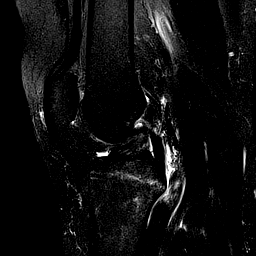
[im 25/37]
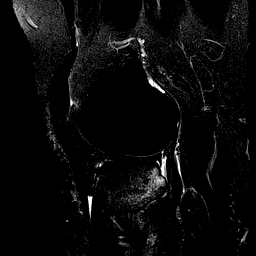
[im 31/37]
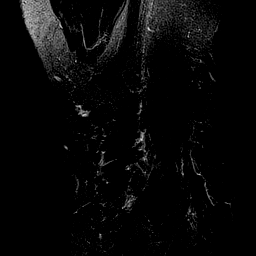
[im 37/37]
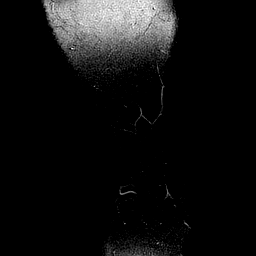

[Series 9: PD fat-sat · oblique · 2.0mm · 0.62mm/px · 3 of 19 slices shown (3 of 3)]
[im 1/19]
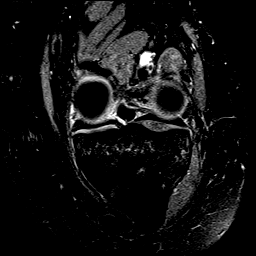
[im 10/19]
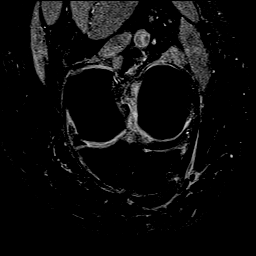
[im 19/19]
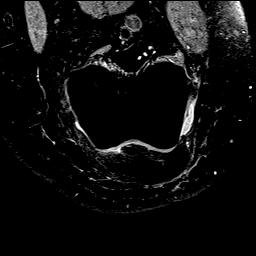

[40 of 40 positions shown; findings below may reference images not displayed]

FINDINGS: MENISCI

Medial: Intact.

Lateral: Intact.

LIGAMENTS

Cruciates: Complete disruption of the ACL near the posterior [DATE]
with edema in the intracondylar notch and retraction of the fibers.
The PCL is intact.

Collaterals: Mild edema seen around the superficial fibers of the
MCL at the tibial insertion site. The lateral collateral ligamentous
complex is intact.

CARTILAGE

Patellofemoral: Mild chondral fissuring seen at the lateral patellar
facet.

Medial compartment: Mild chondral fissuring seen at the medial
femoral condyle.

Lateral compartment: Normal.

BONES: Extensive increased marrow signal seen throughout the
posterior proximal tibia. No definite osseous fracture seen. No
avascular necrosis. No pathologic marrow infiltration.

JOINT: Trace knee joint effusion. Mild edema within the Hoffa's fat
pad. No plical thickening.

EXTENSOR MECHANISM: The patellar and quadriceps tendon are intact.
The retinaculum is unremarkable.

POPLITEAL FOSSA: Increased feathery signal seen within the popliteus
muscle belly and soleus.

OTHER:  The visualized muscles are normal in appearance.
IMPRESSION: 1. Complete disruption of the posterior [DATE] of the ACL with
retraction of fibers and edema in the intracondylar notch.
2. Intact cruciate ligaments
3. Grade 1 medial collateral ligamentous sprain
4. Extensive osseous contusion the proximal posterior tibia. No
definite osseous fracture.
5. Mild medial and patellofemoral compartment chondral disease
6. Trace knee joint effusion
7. Muscular edema within the popliteus and soleus.

## 2018-12-26 IMAGING — MR MR ANKLE*R* W/O CM
5 series · 40 of 40 positions shown · non-contrast
Comparison: None.

CLINICAL DATA: Continued ankle pain, medial malleolar pain

EXAM:
MRI OF THE RIGHT ANKLE WITHOUT CONTRAST
TECHNIQUE: Multiplanar, multisequence MR imaging of the ankle was performed. No
intravenous contrast was administered.

[Series 3: PD fat-sat · axial · 3.0mm · 0.70mm/px · z∈[-10,+126]mm · 9 of 42 slices shown]
[im 1/42]
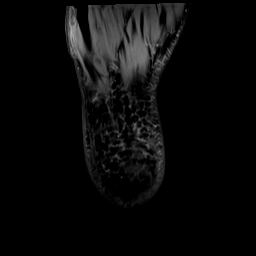
[im 6/42]
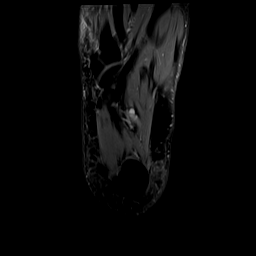
[im 11/42]
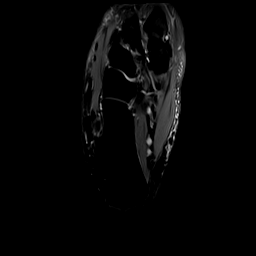
[im 16/42]
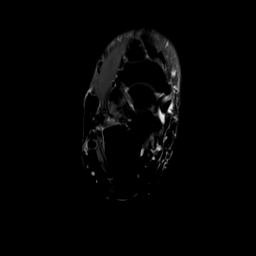
[im 21/42]
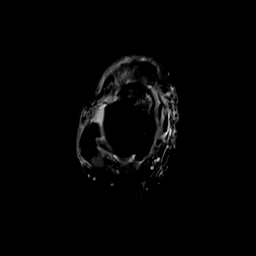
[im 26/42]
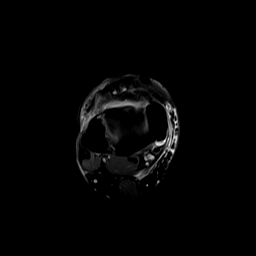
[im 31/42]
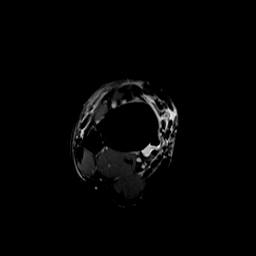
[im 36/42]
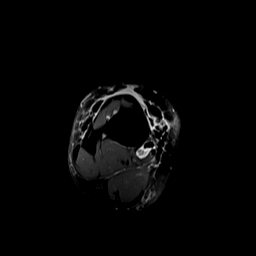
[im 42/42]
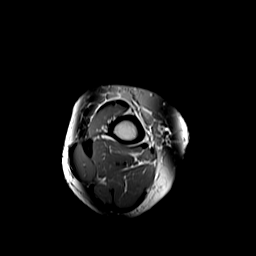

[Series 4: T2 fat-sat · axial · 3.0mm · 0.70mm/px · z∈[-10,+126]mm · 9 of 42 slices shown]
[im 1/42]
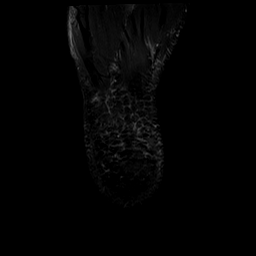
[im 6/42]
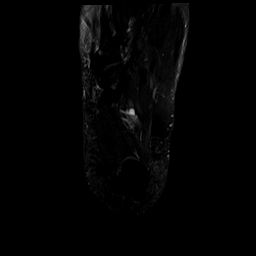
[im 11/42]
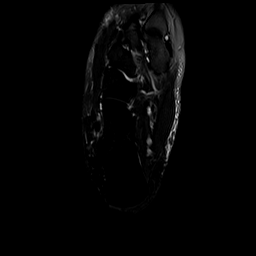
[im 16/42]
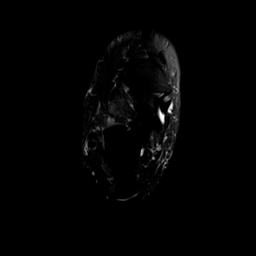
[im 21/42]
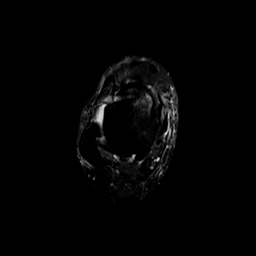
[im 26/42]
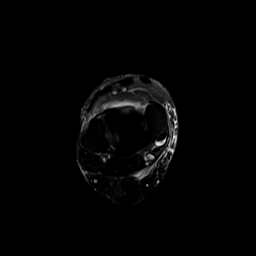
[im 31/42]
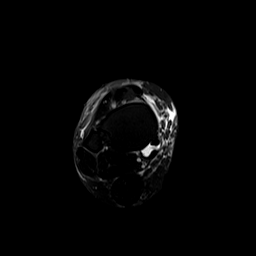
[im 36/42]
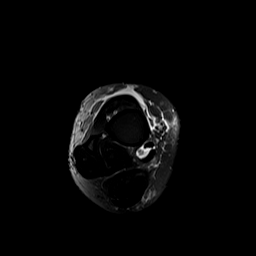
[im 42/42]
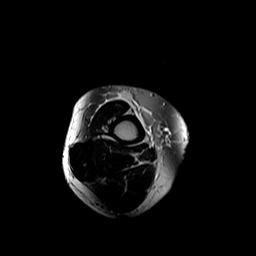

[Series 5: STIR · coronal · 3.0mm · 0.70mm/px · 10 of 44 slices shown (1 of 2)]
[im 1/44]
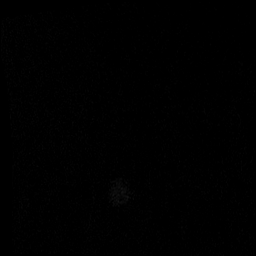
[im 5/44]
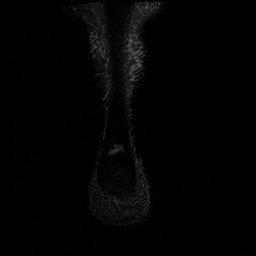
[im 10/44]
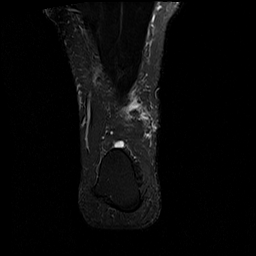
[im 15/44]
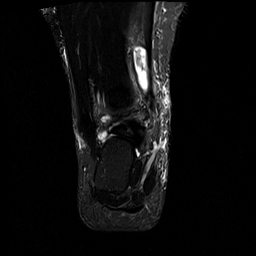
[im 20/44]
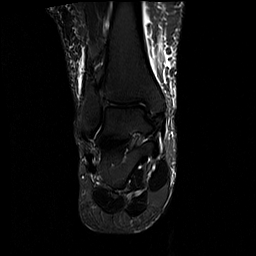
[im 24/44]
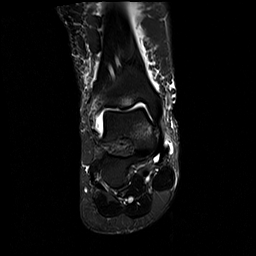
[im 29/44]
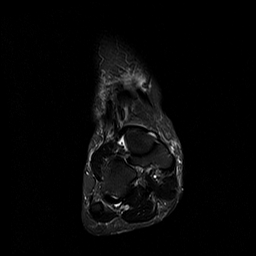
[im 34/44]
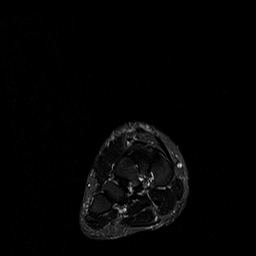
[im 39/44]
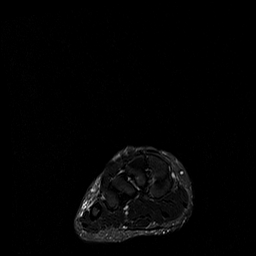
[im 44/44]
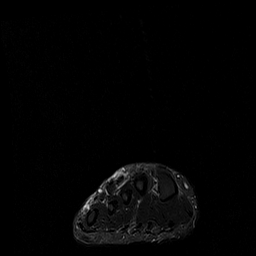

[Series 6: T1 · sagittal · 3.0mm · 0.56mm/px · 6 of 25 slices shown]
[im 1/25]
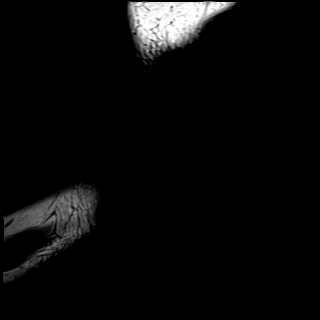
[im 5/25]
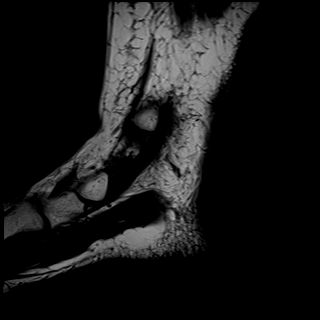
[im 10/25]
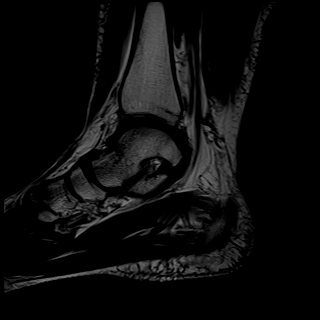
[im 15/25]
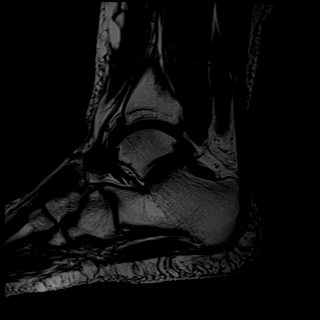
[im 20/25]
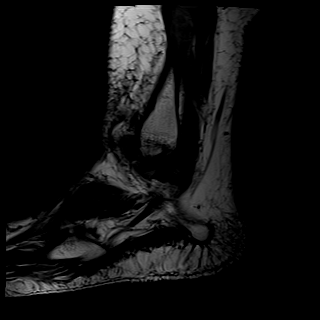
[im 25/25]
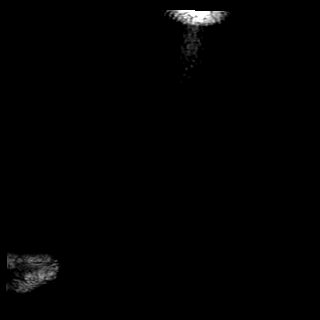

[Series 7: STIR · sagittal · 3.0mm · 0.70mm/px · 6 of 25 slices shown (2 of 2)]
[im 1/25]
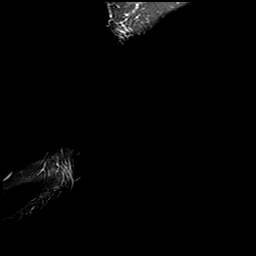
[im 5/25]
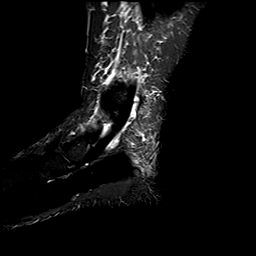
[im 10/25]
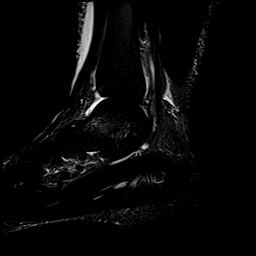
[im 15/25]
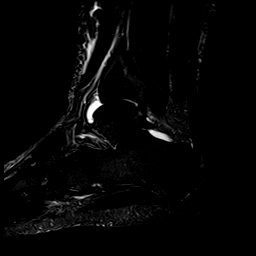
[im 20/25]
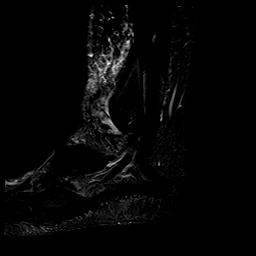
[im 25/25]
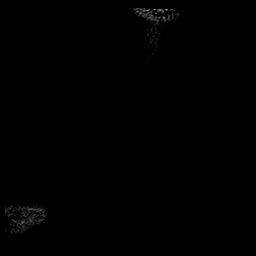

[40 of 40 positions shown; findings below may reference images not displayed]

FINDINGS: TENDONS

Peroneal: Peroneal longus tendon intact. Peroneal brevis intact.

Posteromedial: There is fluid and minimally increased signal seen
surrounding the posterior tibialis tendon, however it is intact.
Flexor hallucis longus tendon intact. Flexor digitorum longus tendon
intact.

Anterior: Tibialis anterior tendon intact. Extensor hallucis longus
tendon intact Extensor digitorum longus tendon intact.

Achilles:  Intact.

Plantar Fascia: Intact.

LIGAMENTS

Lateral: There is a complete disruption of the anterior talofibular
ligament from the fibular insertion with surrounding soft tissue
edema and fluid in the syndesmosis. Calcaneofibular ligament intact.
Posterior talofibular ligament intact. Anterior and posterior
tibiofibular ligaments intact.

Medial: Deltoid ligament intact. Spring ligament intact.

CARTILAGE

Ankle Joint: There is a moderate ankle joint effusion. There is a
focus of subchondral signal change seen at the anterior tibiotalar
joint with underlying chondral surface irregularity.

Subtalar Joints/Sinus Tarsi: There is mild edema and a small amount
of fluid seen at the sinus tarsi and talonavicular joint. Small
subchondral cystic changes are seen at the medial body of the talus.

Bones: No fracture, marrow edema, or pathologic marrow infiltration.

Soft Tissue: There is a diffuse subcutaneous edema seen along the
anterolateral aspect of the ankle.
IMPRESSION: 1. Posterior tibialis tenosynovitis
2. Focal perforation of the anterior talofibular ligament at the
fibular insertion with fluid in the syndesmosis and soft tissue
edema
3. Osteoarthritis at the anterior tibiotalar joint with chondral
irregularity at the distal tibia.
4. Reactive marrow/resolving contusion seen at the medial body of
the talus.
5. Mild edema within the sinus tarsi.
6. Moderate ankle joint effusion
7. Diffuse subcutaneous edema along the anterolateral aspect of the
ankle.

## 2018-12-26 IMAGING — MR MR KNEE*R* W/O CM
7 series · 40 of 40 positions shown · non-contrast
Comparison: None.

CLINICAL DATA: Knee pain, locking catching fall

EXAM:
MRI OF THE RIGHT KNEE WITHOUT CONTRAST
TECHNIQUE: Multiplanar, multisequence MR imaging of the knee was performed. No
intravenous contrast was administered.

[Series 3: T2 fat-sat · axial · 4.0mm · 0.53mm/px · z∈[-60,+109]mm · 5 of 35 slices shown (1 of 3)]
[im 1/35]
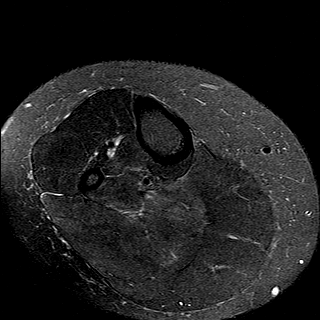
[im 9/35]
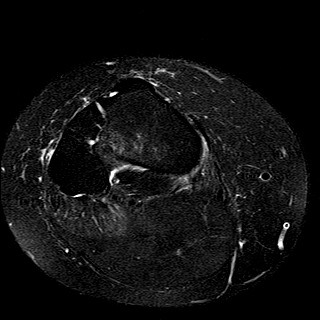
[im 18/35]
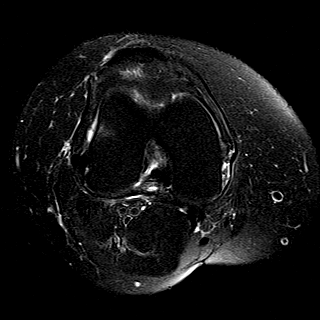
[im 26/35]
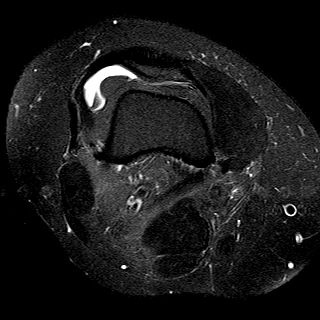
[im 35/35]
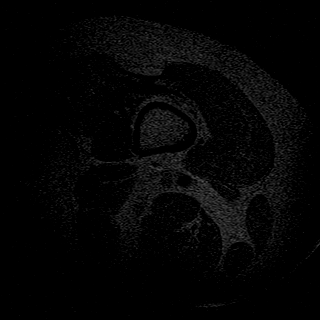

[Series 4: T1 · coronal · 4.0mm · 0.62mm/px · 6 of 31 slices shown]
[im 1/31]
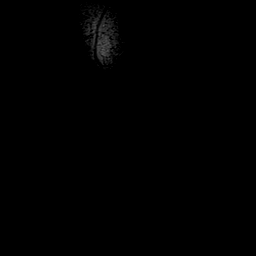
[im 7/31]
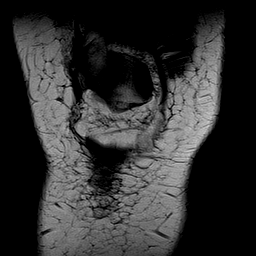
[im 13/31]
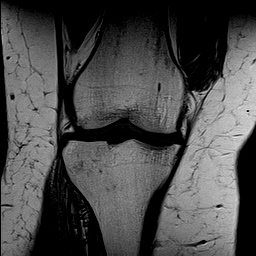
[im 19/31]
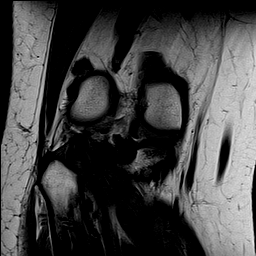
[im 25/31]
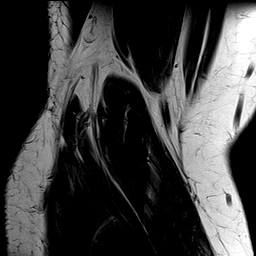
[im 31/31]
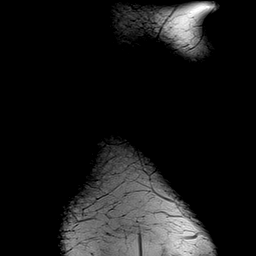

[Series 5: T2 fat-sat · coronal · 4.0mm · 0.62mm/px · 6 of 31 slices shown (2 of 3)]
[im 1/31]
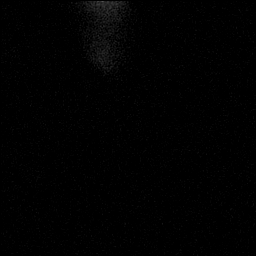
[im 7/31]
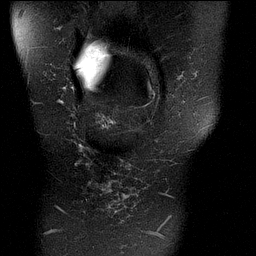
[im 13/31]
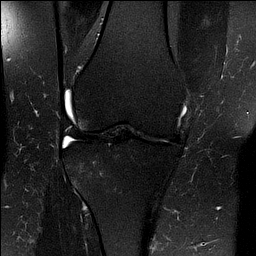
[im 19/31]
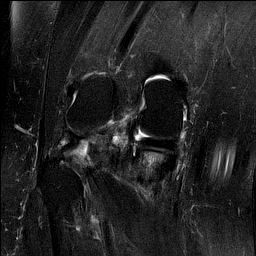
[im 25/31]
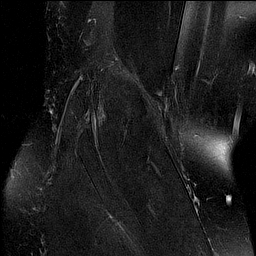
[im 31/31]
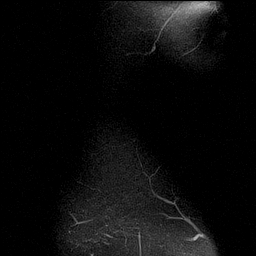

[Series 6: PD fat-sat · coronal · 4.0mm · 0.62mm/px · 6 of 31 slices shown (1 of 3)]
[im 1/31]
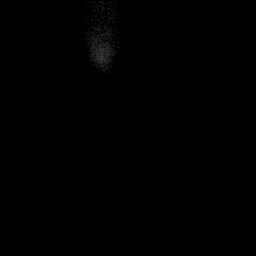
[im 7/31]
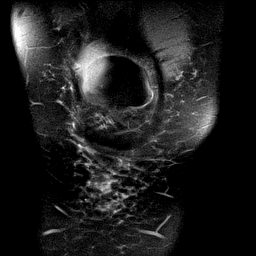
[im 13/31]
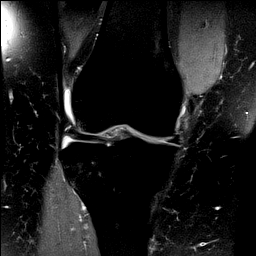
[im 19/31]
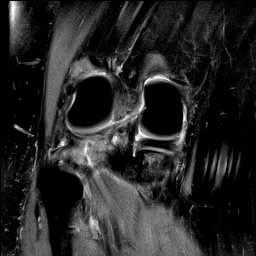
[im 25/31]
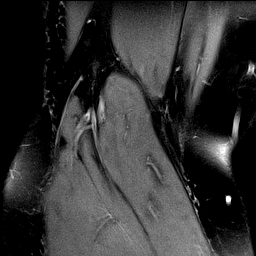
[im 31/31]
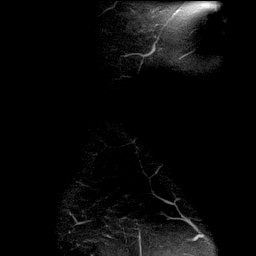

[Series 7: PD fat-sat · sagittal · 3.0mm · 0.62mm/px · 7 of 37 slices shown (2 of 3)]
[im 1/37]
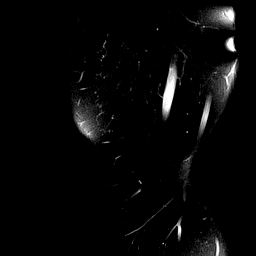
[im 7/37]
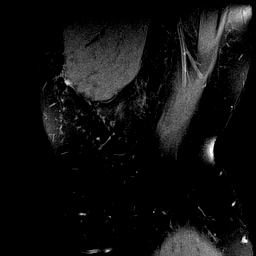
[im 13/37]
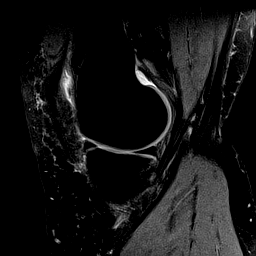
[im 19/37]
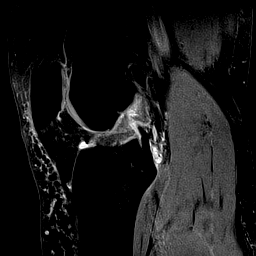
[im 25/37]
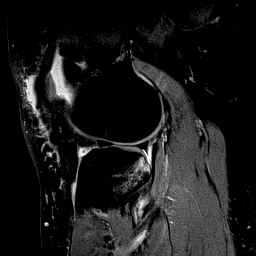
[im 31/37]
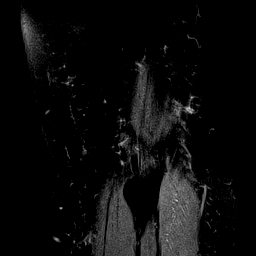
[im 37/37]
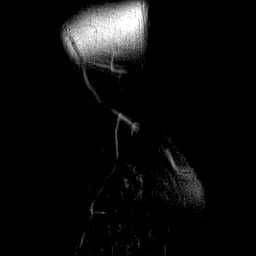

[Series 8: T2 fat-sat · sagittal · 3.0mm · 0.62mm/px · 7 of 37 slices shown (3 of 3)]
[im 1/37]
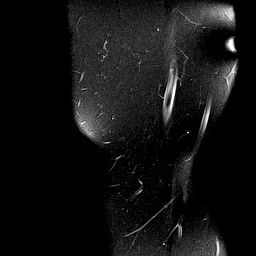
[im 7/37]
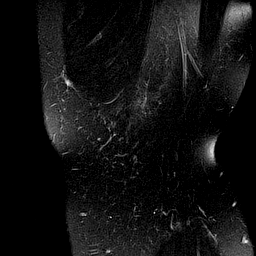
[im 13/37]
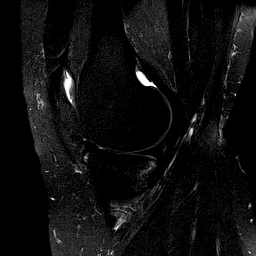
[im 19/37]
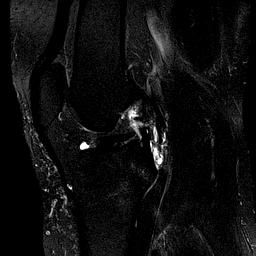
[im 25/37]
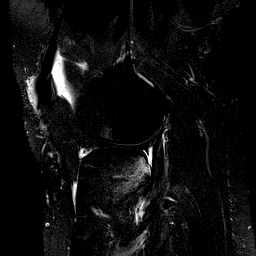
[im 31/37]
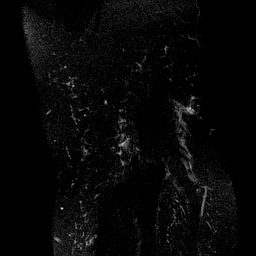
[im 37/37]
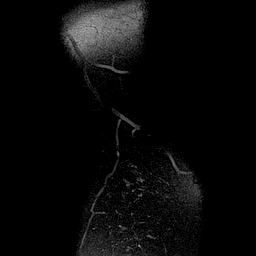

[Series 9: PD fat-sat · coronal · 2.0mm · 0.62mm/px · 3 of 19 slices shown (3 of 3)]
[im 1/19]
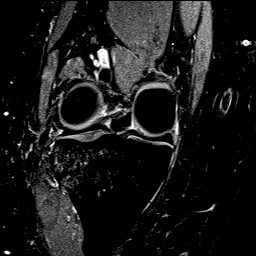
[im 10/19]
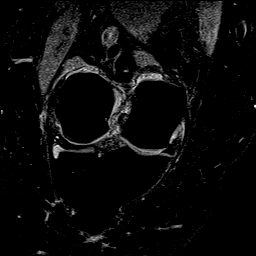
[im 19/19]
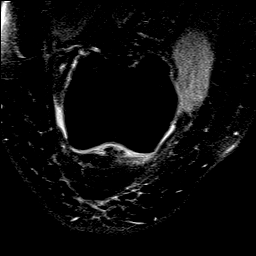

[40 of 40 positions shown; findings below may reference images not displayed]

FINDINGS: MENISCI

Medial: Intact.

Lateral: Intact.

LIGAMENTS

Cruciates: There is a complete disruption of the ACL within the
posterior [DATE] with edema in the intracondylar notch. There is
retraction of the ACL fibers. The PCL is intact.

Collaterals: There is edema seen around the superficial fibers of
the medial collateral ligament, however it is intact. The lateral
collateral ligamentous complex is intact.

CARTILAGE

Patellofemoral: Mild chondral thinning seen in the lateral patellar
facet.

Medial compartment: Mild chondral fissuring seen the weight-bearing
surface of medial femoral condyle.

Lateral compartment: Normal.

BONES: Increased marrow signal seen throughout the posterior
proximal tibial plateau, medial and lateral. There is also a
minimally increased signal seen at the anterior lateral femoral
condyle. No definite osseous fracture seen. No avascular necrosis.
No pathologic marrow infiltration.

JOINT: There is a small knee joint effusion. Edema seen within
Hoffa's fat pad. No plical thickening.

EXTENSOR MECHANISM: The patellar and quadriceps tendon are intact.
The retinaculum is unremarkable.

POPLITEAL FOSSA: No popliteal cyst.

OTHER: There is increased feathery signal seen within the popliteus
and soleus musculature.
IMPRESSION: 1. Complete disruption of the ACL at the posterior [DATE] with
retraction of fibers and edema intracondylar notch.
2. Intact menisci
3. Extensive osseous contusions within the posterior proximal tibia
and minimally within the weight-bearing surface of the lateral
femoral condyle. No definite osseous fracture.
4. Grade 1 medial collateral ligamentous sprain
5. Small knee joint effusion
6. Muscular edema within the soleus and popliteus.

## 2019-01-19 ENCOUNTER — Ambulatory Visit: Payer: BC Managed Care – PPO | Admitting: Sports Medicine
